# Patient Record
Sex: Male | Born: 1980 | Race: Black or African American | Hispanic: No | Marital: Married | Smoking: Never smoker
Health system: Southern US, Community
[De-identification: ages and names within clinical notes are randomized; demographics above are authoritative.]

## PROBLEM LIST (undated history)

## (undated) DIAGNOSIS — R011 Cardiac murmur, unspecified: Secondary | ICD-10-CM

## (undated) DIAGNOSIS — J45909 Unspecified asthma, uncomplicated: Secondary | ICD-10-CM

## (undated) HISTORY — DX: Cardiac murmur, unspecified: R01.1

## (undated) HISTORY — DX: Unspecified asthma, uncomplicated: J45.909

## (undated) HISTORY — PX: NO PAST SURGERIES: SHX2092

---
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
## 2007-05-22 ENCOUNTER — Encounter: Admission: RE | Admit: 2007-05-22 | Discharge: 2007-05-22 | Payer: Self-pay | Admitting: Internal Medicine

## 2008-12-07 ENCOUNTER — Encounter
Admission: RE | Admit: 2008-12-07 | Discharge: 2008-12-07 | Payer: Self-pay | Admitting: Physical Medicine and Rehabilitation

## 2010-01-14 IMAGING — CR DG CHEST 1V
1 series · 1 of 1 positions shown · non-contrast
Comparison: None

CLINICAL DATA: Employment physical examination requirement.

CHEST - 1 VIEW

[w chest pa]
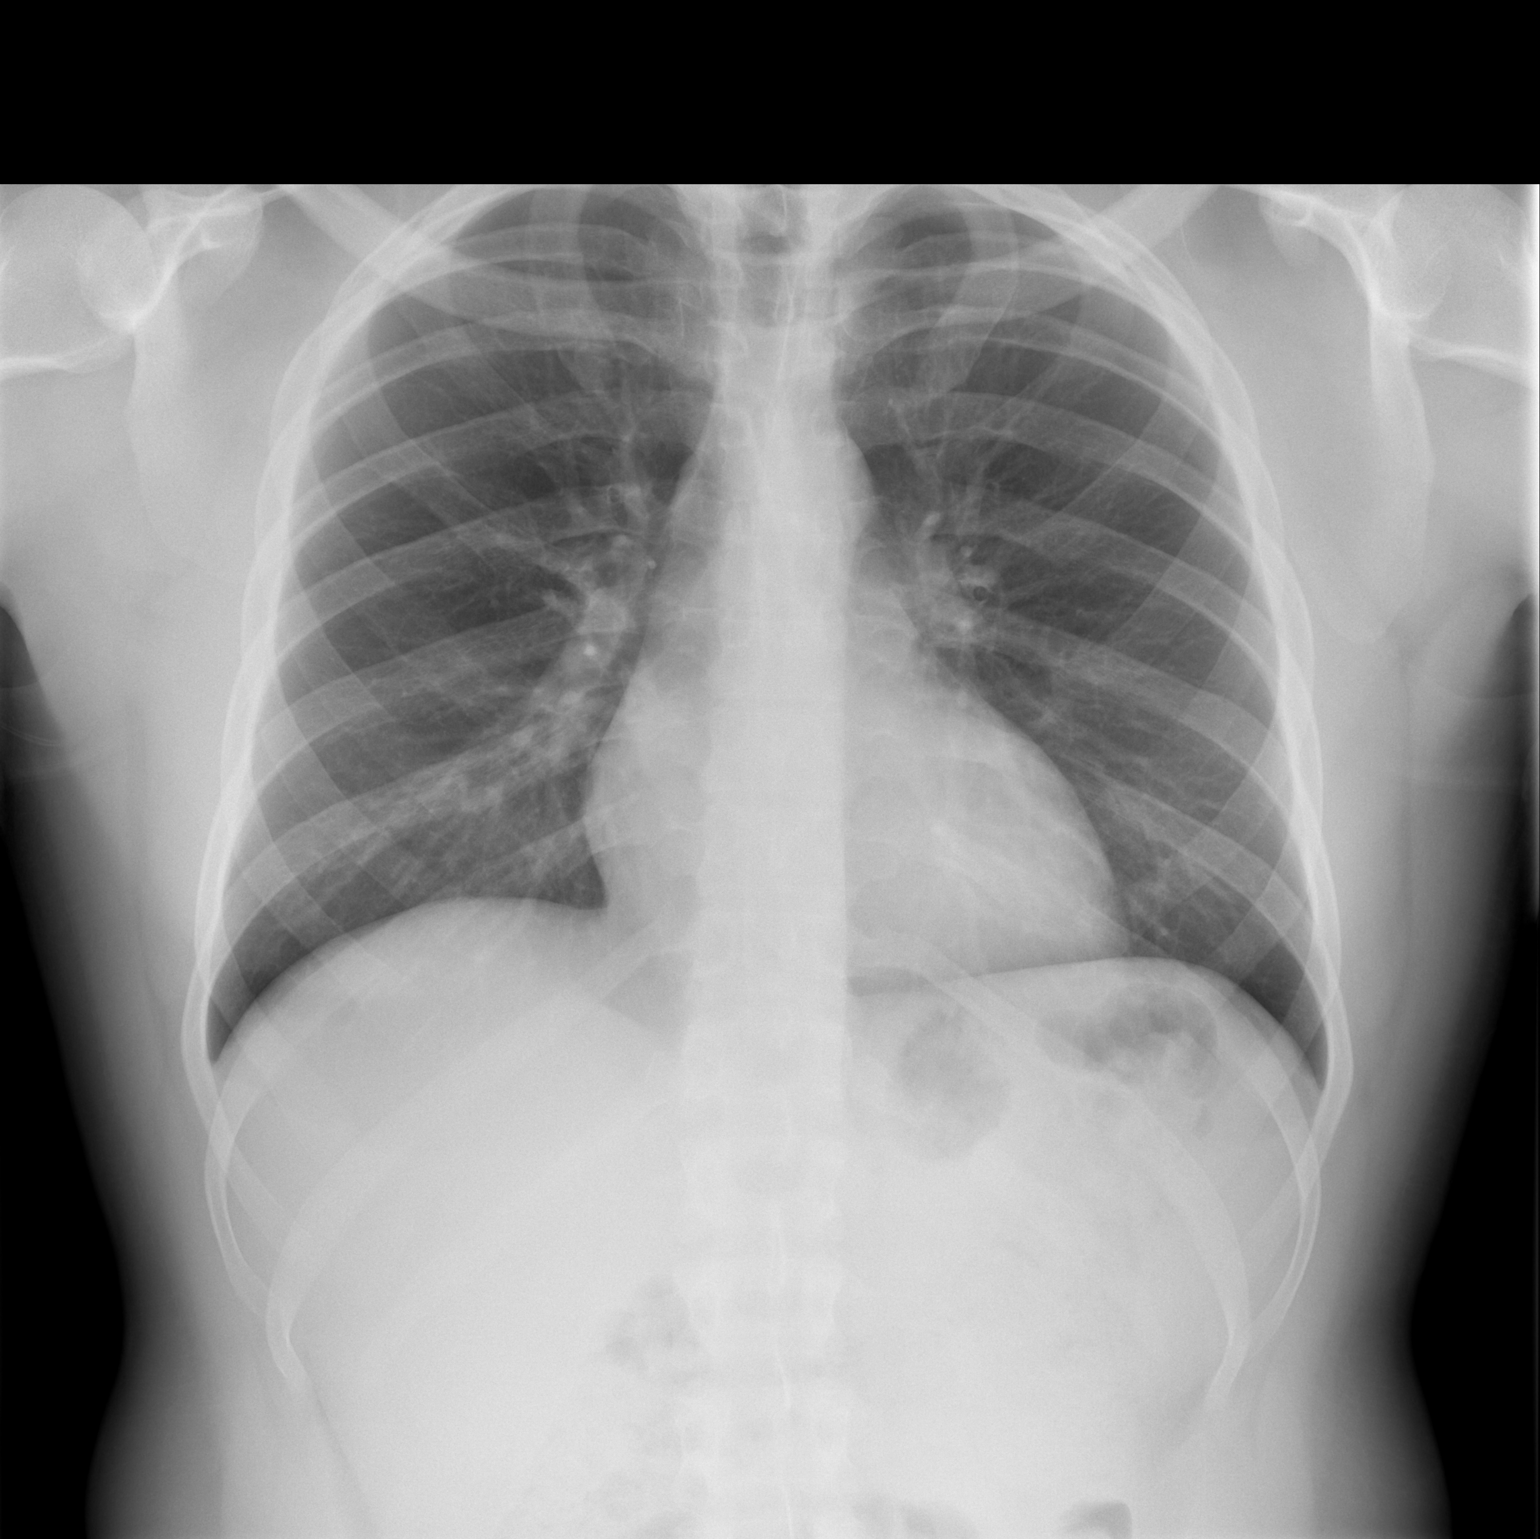

[1 of 1 positions shown; findings below may reference images not displayed]

FINDINGS: The lungs are clear.  No edema or infiltrates are seen.
The heart size, mediastinal contours and hilar contours are within
normal limits.  There are no effusions.  The bony thorax is
unremarkable.
IMPRESSION: Normal chest x-ray.

REF:W2 DICTATED: 12/07/2008 [DATE]

## 2013-02-22 ENCOUNTER — Emergency Department: Payer: Self-pay | Admitting: Emergency Medicine

## 2013-03-08 ENCOUNTER — Emergency Department: Payer: Self-pay | Admitting: Emergency Medicine

## 2016-02-04 ENCOUNTER — Emergency Department: Payer: Managed Care, Other (non HMO)

## 2016-02-04 ENCOUNTER — Emergency Department
Admission: EM | Admit: 2016-02-04 | Discharge: 2016-02-04 | Disposition: A | Discharge status: Home / Self Care | Payer: Managed Care, Other (non HMO) | Attending: Emergency Medicine | Admitting: Emergency Medicine

## 2016-02-04 DIAGNOSIS — N201 Calculus of ureter: Secondary | ICD-10-CM | POA: Insufficient documentation

## 2016-02-04 DIAGNOSIS — R1032 Left lower quadrant pain: Secondary | ICD-10-CM | POA: Diagnosis present

## 2016-02-04 LAB — CBC
HEMATOCRIT: 42.3 % (ref 40.0–52.0)
HEMOGLOBIN: 13.7 g/dL (ref 13.0–18.0)
MCH: 26 pg (ref 26.0–34.0)
MCHC: 32.3 g/dL (ref 32.0–36.0)
MCV: 80.4 fL (ref 80.0–100.0)
Platelets: 169 10*3/uL (ref 150–440)
RBC: 5.26 MIL/uL (ref 4.40–5.90)
RDW: 13.2 % (ref 11.5–14.5)
WBC: 5.9 10*3/uL (ref 3.8–10.6)

## 2016-02-04 LAB — URINALYSIS COMPLETE WITH MICROSCOPIC (ARMC ONLY)
Bilirubin Urine: NEGATIVE
Glucose, UA: NEGATIVE mg/dL
KETONES UR: NEGATIVE mg/dL
LEUKOCYTES UA: NEGATIVE
Nitrite: NEGATIVE
PH: 5 (ref 5.0–8.0)
Protein, ur: 30 mg/dL — AB
SPECIFIC GRAVITY, URINE: 1.021 (ref 1.005–1.030)
SQUAMOUS EPITHELIAL / LPF: NONE SEEN

## 2016-02-04 LAB — COMPREHENSIVE METABOLIC PANEL
ALBUMIN: 4.4 g/dL (ref 3.5–5.0)
ALK PHOS: 63 U/L (ref 38–126)
ALT: 29 U/L (ref 17–63)
ANION GAP: 4 — AB (ref 5–15)
AST: 24 U/L (ref 15–41)
BILIRUBIN TOTAL: 0.5 mg/dL (ref 0.3–1.2)
BUN: 16 mg/dL (ref 6–20)
CALCIUM: 8.7 mg/dL — AB (ref 8.9–10.3)
CO2: 26 mmol/L (ref 22–32)
CREATININE: 1.03 mg/dL (ref 0.61–1.24)
Chloride: 108 mmol/L (ref 101–111)
GFR calc Af Amer: >60 mL/min (ref >60)
GFR calc non Af Amer: >60 mL/min (ref >60)
GLUCOSE: 114 mg/dL — AB (ref 65–99)
Potassium: 3.6 mmol/L (ref 3.5–5.1)
SODIUM: 138 mmol/L (ref 135–145)
TOTAL PROTEIN: 7.2 g/dL (ref 6.5–8.1)

## 2016-02-04 MED ORDER — SODIUM CHLORIDE 0.9 % IV BOLUS (SEPSIS)
1000.0000 mL | Freq: Once | INTRAVENOUS | Status: AC
  Administered 2016-02-04: 1000 mL via INTRAVENOUS

## 2016-02-04 MED ORDER — ONDANSETRON HCL 4 MG/2ML IJ SOLN
4.0000 mg | Freq: Once | INTRAMUSCULAR | Status: AC
  Administered 2016-02-04: 4 mg via INTRAVENOUS
  Filled 2016-02-04: qty 2

## 2016-02-04 MED ORDER — MORPHINE SULFATE (PF) 4 MG/ML IV SOLN
4.0000 mg | Freq: Once | INTRAVENOUS | Status: AC
  Administered 2016-02-04: 4 mg via INTRAVENOUS
  Filled 2016-02-04: qty 1

## 2016-02-04 MED ORDER — KETOROLAC TROMETHAMINE 30 MG/ML IJ SOLN
30.0000 mg | Freq: Once | INTRAMUSCULAR | Status: AC
  Administered 2016-02-04: 30 mg via INTRAVENOUS
  Filled 2016-02-04: qty 1

## 2016-02-04 MED ORDER — OXYCODONE-ACETAMINOPHEN 5-325 MG PO TABS: 1.0000 | ORAL_TABLET | Freq: Four times a day (QID) | ORAL | Status: DC | PRN

## 2016-02-04 NOTE — ED Notes (Signed)
Pt updated on ct result progress.

## 2016-02-04 NOTE — Discharge Instructions (Signed)
Kidney Stones °Kidney stones (urolithiasis) are deposits that form inside your kidneys. The intense pain is caused by the stone moving through the urinary tract. When the stone moves, the ureter goes into spasm around the stone. The stone is usually passed in the urine.  °CAUSES  °· A disorder that makes certain neck glands produce too much parathyroid hormone (primary hyperparathyroidism). °· A buildup of uric acid crystals, similar to gout in your joints. °· Narrowing (stricture) of the ureter. °· A kidney obstruction present at birth (congenital obstruction). °· Previous surgery on the kidney or ureters. °· Numerous kidney infections. °SYMPTOMS  °· Feeling sick to your stomach (nauseous). °· Throwing up (vomiting). °· Blood in the urine (hematuria). °· Pain that usually spreads (radiates) to the groin. °· Frequency or urgency of urination. °DIAGNOSIS  °· Taking a history and physical exam. °· Blood or urine tests. °· CT scan. °· Occasionally, an examination of the inside of the urinary bladder (cystoscopy) is performed. °TREATMENT  °· Observation. °· Increasing your fluid intake. °· Extracorporeal shock wave lithotripsy--This is a noninvasive procedure that uses shock waves to break up kidney stones. °· Surgery may be needed if you have severe pain or persistent obstruction. There are various surgical procedures. Most of the procedures are performed with the use of small instruments. Only small incisions are needed to accommodate these instruments, so recovery time is minimized. °The size, location, and chemical composition are all important variables that will determine the proper choice of action for you. Talk to your health care provider to better understand your situation so that you will minimize the risk of injury to yourself and your kidney.  °HOME CARE INSTRUCTIONS  °· Drink enough water and fluids to keep your urine clear or pale yellow. This will help you to pass the stone or stone fragments. °· Strain  all urine through the provided strainer. Keep all particulate matter and stones for your health care provider to see. The stone causing the pain may be as small as a grain of salt. It is very important to use the strainer each and every time you pass your urine. The collection of your stone will allow your health care provider to analyze it and verify that a stone has actually passed. The stone analysis will often identify what you can do to reduce the incidence of recurrences. °· Only take over-the-counter or prescription medicines for pain, discomfort, or fever as directed by your health care provider. °· Keep all follow-up visits as told by your health care provider. This is important. °· Get follow-up X-rays if required. The absence of pain does not always mean that the stone has passed. It may have only stopped moving. If the urine remains completely obstructed, it can cause loss of kidney function or even complete destruction of the kidney. It is your responsibility to make sure X-rays and follow-ups are completed. Ultrasounds of the kidney can show blockages and the status of the kidney. Ultrasounds are not associated with any radiation and can be performed easily in a matter of minutes. °· Make changes to your daily diet as told by your health care provider. You may be told to: °¨ Limit the amount of salt that you eat. °¨ Eat 5 or more servings of fruits and vegetables each day. °¨ Limit the amount of meat, poultry, fish, and eggs that you eat. °· Collect a 24-hour urine sample as told by your health care provider. You may need to collect another urine sample every 6-12   months. °SEEK MEDICAL CARE IF: °· You experience pain that is progressive and unresponsive to any pain medicine you have been prescribed. °SEEK IMMEDIATE MEDICAL CARE IF:  °· Pain cannot be controlled with the prescribed medicine. °· You have a fever or shaking chills. °· The severity or intensity of pain increases over 18 hours and is not  relieved by pain medicine. °· You develop a new onset of abdominal pain. °· You feel faint or pass out. °· You are unable to urinate. °  °This information is not intended to replace advice given to you by your health care provider. Make sure you discuss any questions you have with your health care provider. °  °Document Released: 10/16/2005 Document Revised: 07/07/2015 Document Reviewed: 03/19/2013 °Elsevier Interactive Patient Education ©2016 [employer] ° °

## 2016-02-04 NOTE — ED Notes (Signed)
Report received from kerry, rn.  

## 2016-02-04 NOTE — ED Provider Notes (Signed)
[facility] Emergency Department Provider Note  Time seen: 6:19 PM  I have reviewed the triage vital signs and the nursing notes.   HISTORY  Chief Complaint Flank Pain    HPI Martin Higgins is a 35 y.o. male with no past medical history who presents to the emergency department with sudden onset of left flank pain. According to the patient approximately 2 hours prior to arrival had sudden onset of left flank pain which has progressively worsened. He now describes the pain as a 10/10. Patient states he was driving and the pain worsened so he called EMS who brought him to the hospital. Patient denies any dysuria, hematuria, history of kidney stones. Denies any changes with his bowel movements states normal bowel movement today. States nausea with the pain but denies any vomiting. Denies any fever, cough or congestion. Denies any strenuous activity today. Currently describes his left flank pain as severe 10/10 sharp pain mostly in the anterior left abdomen.     History reviewed. No pertinent past medical history.  There are no active problems to display for this patient.   History reviewed. No pertinent past surgical history.  No current outpatient prescriptions on file.  Allergies Review of patient's allergies indicates no known allergies.  History reviewed. No pertinent family history.  Social History Social History  Substance Use Topics  . Smoking status: Never Smoker   . Smokeless tobacco: None  . Alcohol Use: No    Review of Systems Constitutional: Negative for fever. Cardiovascular: Negative for chest pain. Respiratory: Negative for shortness of breath. Gastrointestinal:Left flank pain. Positive for nausea. Negative for vomiting or diarrhea. Genitourinary: Negative for dysuria. Negative hematuria. Musculoskeletal: Positive left flank/left back pain Skin: Negative for rash. Neurological: Negative for headache 10-point ROS otherwise  negative.  ____________________________________________   PHYSICAL EXAM:  VITAL SIGNS: ED Triage Vitals  Enc Vitals Group     BP 02/04/16 1807 151/81 mmHg     Pulse Rate 02/04/16 1807 78     Resp 02/04/16 1807 22     Temp --      Temp src --      SpO2 02/04/16 1807 97 %     Weight 02/04/16 1807 225 lb (102.059 kg)     Height 02/04/16 1807 5\' 11"  (1.803 m)     Head Cir --      Peak Flow --      Pain Score 02/04/16 1809 8     Pain Loc --      Pain Edu? --      Excl. in GC? --     Constitutional: Alert and oriented. Well appearing and in no distress. Eyes: Normal exam ENT   Head: Normocephalic and atraumatic.   Mouth/Throat: Mucous membranes are moist. Cardiovascular: Normal rate, regular rhythm. No murmur Respiratory: Normal respiratory effort without tachypnea nor retractions. Breath sounds are clear  Gastrointestinal: Soft, moderate tenderness palpation of the left abdomen, no rebound or guarding. No distention. No CVA tenderness. Musculoskeletal: Nontender with normal range of motion in all extremities. N Neurologic:  Normal speech and language. No gross focal neurologic deficits  Skin:  Skin is warm, dry and intact.  Psychiatric: Mood and affect are normal. Speech and behavior are normal.  ____________________________________________   RADIOLOGY  CT shows a 3 mm left ureteral lithiasis nearly in the bladder.  ____________________________________________    INITIAL IMPRESSION / ASSESSMENT AND PLAN / ED COURSE  Pertinent labs & imaging results that were available during my care of  the patient were reviewed by me and considered in my medical decision making (see chart for details).  She presents by EMS for sudden onset of left flank pain. Patient continues to have 10/10 left flank pain in the emergency department. No history of kidney stones, however the patient's story is very suggestive of ureterolithiasis. Patient does have moderate tenderness to  palpation on exam. We will dose pain medication, check labs, urinalysis, and proceed to CT renal scan to rule out ureterolithiasis.  CT shows 3 mm stone in the distal ureter versus in the bladder. Also consistent with constipation. We'll place the patient on Percocet, Colace, have the patient follow-up with Dr. Apolinar Junes, we'll also provide a urinary strainer. Patient agreeable to plan. Patient states his pain is much improved from earlier.  ____________________________________________   FINAL CLINICAL IMPRESSION(S) / ED DIAGNOSES  Left flank pain Ureterolithiasis  Minna Antis, MD 02/04/16 2037

## 2016-02-04 NOTE — ED Notes (Addendum)
Pt presents with sudden onset left flank pain anterior- states that he felt a sharp pain.  + nausea.  No hx of same.   Per EMS pt had 100 mcg of fentanyl. Pt reports that the pain improved only slightly.

## 2016-02-23 ENCOUNTER — Encounter: Payer: Self-pay | Admitting: Urology

## 2016-02-23 ENCOUNTER — Ambulatory Visit (INDEPENDENT_AMBULATORY_CARE_PROVIDER_SITE_OTHER): Payer: Managed Care, Other (non HMO) | Admitting: Urology

## 2016-02-23 VITALS — BP 124/79 | HR 68 | Ht 69.0 in | Wt 218.7 lb

## 2016-02-23 DIAGNOSIS — S43006A Unspecified dislocation of unspecified shoulder joint, initial encounter: Secondary | ICD-10-CM | POA: Insufficient documentation

## 2016-02-23 DIAGNOSIS — N200 Calculus of kidney: Secondary | ICD-10-CM | POA: Diagnosis not present

## 2016-02-23 DIAGNOSIS — I456 Pre-excitation syndrome: Secondary | ICD-10-CM | POA: Insufficient documentation

## 2016-02-23 DIAGNOSIS — Z9109 Other allergy status, other than to drugs and biological substances: Secondary | ICD-10-CM | POA: Insufficient documentation

## 2016-02-23 DIAGNOSIS — J45909 Unspecified asthma, uncomplicated: Secondary | ICD-10-CM | POA: Insufficient documentation

## 2016-02-23 LAB — URINALYSIS, COMPLETE
BILIRUBIN UA: NEGATIVE
GLUCOSE, UA: NEGATIVE
Ketones, UA: NEGATIVE
LEUKOCYTES UA: NEGATIVE
Nitrite, UA: NEGATIVE
PROTEIN UA: NEGATIVE
RBC UA: NEGATIVE
SPEC GRAV UA: 1.02 (ref 1.005–1.030)
UUROB: 0.2 mg/dL (ref 0.2–1.0)
pH, UA: 5.5 (ref 5.0–7.5)

## 2016-02-23 LAB — MICROSCOPIC EXAMINATION
BACTERIA UA: NONE SEEN
Epithelial Cells (non renal): NONE SEEN /hpf (ref 0–10)
WBC UA: NONE SEEN /HPF (ref 0–?)

## 2016-02-23 NOTE — Progress Notes (Signed)
02/23/2016 10:43 AM   Corey HaroldErik Mckay 08/24/1981 [id number]  Referring provider: No referring provider defined for this encounter.  Chief Complaint  Patient presents with  . New Patient (Initial Visit)    renal stones     HPI: The patient is a 35 year old gentleman who recently passed 2 left ureteral calculi a few weeks ago. He has had no problems since he passes stones. His only complaint at the time of stone passage with severe pain. He had no nausea or vomiting or fevers or chills. He is doing well today and is back to baseline. He's never had stones before.   PMH: Past Medical History  Diagnosis Date  . Asthma   . Heart murmur     Surgical History: Past Surgical History  Procedure Laterality Date  . No past surgeries      Home Medications:    Medication List       This list is accurate as of: 02/23/16 10:43 AM.  Always use your most recent med list.               PROAIR HFA 108 (90 Base) MCG/ACT inhaler  Generic drug:  albuterol  Inhale into the lungs.        Allergies: No Known Allergies  Family History: Family History  Problem Relation Age of Onset  . Hematuria Neg Hx   . Renal cancer Neg Hx   . Prostate cancer Neg Hx     Social History:  reports that he has never smoked. He does not have any smokeless tobacco history on file. He reports that he does not drink alcohol or use illicit drugs.  ROS: UROLOGY Frequent Urination?: No Hard to postpone urination?: No Burning/pain with urination?: No Get up at night to urinate?: Yes Leakage of urine?: No Urine stream starts and stops?: No Trouble starting stream?: No Do you have to strain to urinate?: No Blood in urine?: No Urinary tract infection?: No Sexually transmitted disease?: No Injury to kidneys or bladder?: No Painful intercourse?: No Weak stream?: No Erection problems?: No Penile pain?: No  Gastrointestinal Nausea?: No Vomiting?: No Indigestion/heartburn?: No Diarrhea?:  No Constipation?: No  Constitutional Fever: No Night sweats?: Yes Weight loss?: No Fatigue?: No  Skin Skin rash/lesions?: No Itching?: No  Eyes Blurred vision?: No Double vision?: No  Ears/Nose/Throat Sore throat?: No Sinus problems?: No  Hematologic/Lymphatic Swollen glands?: No Easy bruising?: No  Cardiovascular Leg swelling?: No Chest pain?: No  Respiratory Cough?: No Shortness of breath?: No  Endocrine Excessive thirst?: No  Musculoskeletal Back pain?: No Joint pain?: No  Neurological Headaches?: No Dizziness?: No  Psychologic Depression?: No Anxiety?: No  Physical Exam: BP 124/79 mmHg  Pulse 68  Ht 5\' 9"  (1.753 m)  Wt 218 lb 11.2 oz (99.202 kg)  BMI 32.28 kg/m2  Constitutional:  Alert and oriented, No acute distress. HEENT: [place] AT, moist mucus membranes.  Trachea midline, no masses. Cardiovascular: No clubbing, cyanosis, or edema. Respiratory: Normal respiratory effort, no increased work of breathing. GI: Abdomen is soft, nontender, nondistended, no abdominal masses GU: No CVA tenderness.  Skin: No rashes, bruises or suspicious lesions. Lymph: No cervical or inguinal adenopathy. Neurologic: Grossly intact, no focal deficits, moving all 4 extremities. Psychiatric: Normal mood and affect.  Laboratory Data: Lab Results  Component Value Date   WBC 5.9 02/04/2016   HGB 13.7 02/04/2016   HCT 42.3 02/04/2016   MCV 80.4 02/04/2016   PLT 169 02/04/2016    Lab Results  Component Value  Date   CREATININE 1.03 02/04/2016    No results found for: PSA  No results found for: TESTOSTERONE  No results found for: HGBA1C  Urinalysis    Component Value Date/Time   COLORURINE YELLOW* 02/04/2016 1937   APPEARANCEUR HAZY* 02/04/2016 1937   LABSPEC 1.021 02/04/2016 1937   PHURINE 5.0 02/04/2016 1937   GLUCOSEU NEGATIVE 02/04/2016 1937   HGBUR 3+* 02/04/2016 1937   BILIRUBINUR NEGATIVE 02/04/2016 1937   KETONESUR NEGATIVE 02/04/2016 1937    PROTEINUR 30* 02/04/2016 1937   NITRITE NEGATIVE 02/04/2016 1937   LEUKOCYTESUR NEGATIVE 02/04/2016 1937    Pertinent Imaging: CLINICAL DATA: Right flank pain.  EXAM: CT ABDOMEN AND PELVIS WITHOUT CONTRAST  TECHNIQUE: Multidetector CT imaging of the abdomen and pelvis was performed following the standard protocol without IV contrast.  COMPARISON: None.  FINDINGS: Normal lung bases.  No free air or free fluid.  No renal stones are identified. No hydronephrosis or mass seen in the right kidney. The right ureter is normal in caliber with no right ureteral stones. There is mild hydronephrosis on the left with mild fat stranding adjacent to the left renal pelvis and proximal ureter. The left ureter is larger than the right and there is a 3 mm stone just within the left side of the bladder.  The liver, gallbladder, spleen, adrenal glands, and pancreas are normal. A hiatal hernia is identified. The colon and appendix are normal. The stomach is normal. There is fecal material within left lower quadrant loops of small bowel with no bowel wall thickening. No aneurysm. No adenopathy or mass.  The pelvis demonstrates a small stone just within the left side of the bladder. Fecal material is seen within small bowel loops in the upper left pelvis and lower abdomen. No other abnormalities.  Visualized bones are normal.  IMPRESSION: 1. 3 mm stone just within the left side of the bladder with mild ureterectasis and mild perinephric stranding. The patient's pain was reported to be on the right but the findings are most consistent with a recently passed left stone. 2. Fecal material within small bowel loops in the lower left abdomen and upper left pelvis. This suggests slow transit time but no underlying cause is seen.  Assessment & Plan:    1. Renal stones -The patient spontaneously passed his stones and has no further stone burden. He'll bring his stone back to the  office for analysis and we will let him know his results. He will otherwise follow-up as needed.   Return if symptoms worsen or fail to improve.  Hildred Laser, MD  [facility] [address], [address] [place], [place] [address] [phone]

## 2018-08-07 ENCOUNTER — Emergency Department
Admission: EM | Admit: 2018-08-07 | Discharge: 2018-08-07 | Disposition: A | Discharge status: Home / Self Care | Payer: Commercial Managed Care - PPO | Attending: Emergency Medicine | Admitting: Emergency Medicine

## 2018-08-07 ENCOUNTER — Other Ambulatory Visit: Payer: Self-pay

## 2018-08-07 ENCOUNTER — Encounter: Payer: Self-pay | Admitting: Emergency Medicine

## 2018-08-07 ENCOUNTER — Emergency Department: Payer: Commercial Managed Care - PPO

## 2018-08-07 DIAGNOSIS — N200 Calculus of kidney: Secondary | ICD-10-CM | POA: Diagnosis not present

## 2018-08-07 DIAGNOSIS — R109 Unspecified abdominal pain: Secondary | ICD-10-CM | POA: Diagnosis present

## 2018-08-07 DIAGNOSIS — J45909 Unspecified asthma, uncomplicated: Secondary | ICD-10-CM | POA: Insufficient documentation

## 2018-08-07 LAB — CBC
HEMATOCRIT: 46.3 % (ref 39.0–52.0)
Hemoglobin: 14.8 g/dL (ref 13.0–17.0)
MCH: 27 pg (ref 26.0–34.0)
MCHC: 32 g/dL (ref 30.0–36.0)
MCV: 84.3 fL (ref 80.0–100.0)
NRBC: 0 % (ref 0.0–0.2)
Platelets: 209 10*3/uL (ref 150–400)
RBC: 5.49 MIL/uL (ref 4.22–5.81)
RDW: 12.3 % (ref 11.5–15.5)
WBC: 5.8 10*3/uL (ref 4.0–10.5)

## 2018-08-07 LAB — URINALYSIS, COMPLETE (UACMP) WITH MICROSCOPIC
BACTERIA UA: NONE SEEN
BILIRUBIN URINE: NEGATIVE
Glucose, UA: NEGATIVE mg/dL
HGB URINE DIPSTICK: NEGATIVE
Ketones, ur: NEGATIVE mg/dL
LEUKOCYTES UA: NEGATIVE
Nitrite: NEGATIVE
PROTEIN: NEGATIVE mg/dL
Specific Gravity, Urine: 1.023 (ref 1.005–1.030)
pH: 5 (ref 5.0–8.0)

## 2018-08-07 LAB — COMPREHENSIVE METABOLIC PANEL
ALT: 21 U/L (ref 0–44)
AST: 20 U/L (ref 15–41)
Albumin: 4.3 g/dL (ref 3.5–5.0)
Alkaline Phosphatase: 57 U/L (ref 38–126)
Anion gap: 7 (ref 5–15)
BUN: 15 mg/dL (ref 6–20)
CHLORIDE: 105 mmol/L (ref 98–111)
CO2: 30 mmol/L (ref 22–32)
CREATININE: 1.24 mg/dL (ref 0.61–1.24)
Calcium: 9.2 mg/dL (ref 8.9–10.3)
GFR calc non Af Amer: >60 mL/min (ref >60)
Glucose, Bld: 114 mg/dL — ABNORMAL HIGH (ref 70–99)
Potassium: 3.7 mmol/L (ref 3.5–5.1)
Sodium: 142 mmol/L (ref 135–145)
Total Bilirubin: 0.3 mg/dL (ref 0.3–1.2)
Total Protein: 6.9 g/dL (ref 6.5–8.1)

## 2018-08-07 LAB — LIPASE, BLOOD: LIPASE: 33 U/L (ref 11–51)

## 2018-08-07 MED ORDER — TAMSULOSIN HCL 0.4 MG PO CAPS: 0.4000 mg | ORAL_CAPSULE | Freq: Every day | ORAL | 0 refills | Status: AC

## 2018-08-07 MED ORDER — ONDANSETRON HCL 4 MG/2ML IJ SOLN
4.0000 mg | Freq: Once | INTRAMUSCULAR | Status: AC
  Administered 2018-08-07: 4 mg via INTRAVENOUS
  Filled 2018-08-07: qty 2

## 2018-08-07 MED ORDER — TAMSULOSIN HCL 0.4 MG PO CAPS
0.4000 mg | ORAL_CAPSULE | Freq: Once | ORAL | Status: AC
  Administered 2018-08-07: 0.4 mg via ORAL
  Filled 2018-08-07: qty 1

## 2018-08-07 MED ORDER — ONDANSETRON 4 MG PO TBDP: 4.0000 mg | ORAL_TABLET | Freq: Three times a day (TID) | ORAL | 0 refills | Status: AC | PRN

## 2018-08-07 MED ORDER — IOPAMIDOL (ISOVUE-300) INJECTION 61%
100.0000 mL | Freq: Once | INTRAVENOUS | Status: AC | PRN
  Administered 2018-08-07: 100 mL via INTRAVENOUS

## 2018-08-07 MED ORDER — OXYCODONE HCL 5 MG PO TABS
5.0000 mg | ORAL_TABLET | Freq: Once | ORAL | Status: AC
  Administered 2018-08-07: 5 mg via ORAL
  Filled 2018-08-07: qty 1

## 2018-08-07 MED ORDER — OXYCODONE-ACETAMINOPHEN 5-325 MG PO TABS: 1.0000 | ORAL_TABLET | ORAL | 0 refills | Status: AC | PRN

## 2018-08-07 MED ORDER — KETOROLAC TROMETHAMINE 30 MG/ML IJ SOLN
15.0000 mg | Freq: Once | INTRAMUSCULAR | Status: AC
  Administered 2018-08-07: 15 mg via INTRAVENOUS
  Filled 2018-08-07: qty 1

## 2018-08-07 MED ORDER — IBUPROFEN 600 MG PO TABS: 600.0000 mg | ORAL_TABLET | Freq: Four times a day (QID) | ORAL | 0 refills | Status: AC | PRN

## 2018-08-07 MED ORDER — ACETAMINOPHEN 500 MG PO TABS
1000.0000 mg | ORAL_TABLET | Freq: Once | ORAL | Status: AC
Start: 2018-08-07 — End: 2018-08-07
  Administered 2018-08-07: 1000 mg via ORAL
  Filled 2018-08-07: qty 2

## 2018-08-07 NOTE — ED Triage Notes (Signed)
Pt in via POV with complaints acute onset RUQ abdominal pain w/ some nausea, denies vomiting/diarrhea.  Pt ambulatory to triage, NAD noted at this time.

## 2018-08-07 NOTE — ED Provider Notes (Signed)
[facility] Emergency Department Provider Note  ____________________________________________  Time seen: Approximately 6:11 PM  I have reviewed the triage vital signs and the nursing notes.   HISTORY  Chief Complaint Abdominal Pain   HPI Martin Higgins is a 37 y.o. male with a history of asthma who presents for evaluation of abdominal pain.  Patient reports sudden onset of sharp right-sided abdominal pain that woke him up from his sleep at 3 PM this afternoon.  The pain is currently 8 out of 10.  He denies any similar pain in the past.  Had a history of a kidney stone however the pain at that time was in the flank.  No hematuria, dysuria, vomiting, fever, chills, constipation, diarrhea, chest pain or shortness of breath.  The pain is not pleuritic in nature.  No personal or family history of blood clots, recent travel immobilization, leg pain or swelling, hemoptysis, or exogenous hormones.  No past abdominal surgeries.  Past Medical History:  Diagnosis Date  . Asthma   . Heart murmur     Patient Active Problem List   Diagnosis Date Noted  . Asthma without status asthmaticus 02/23/2016  . 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 Dislocated shoulder 02/23/2016  . 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 NO PAST SURGERIES      Prior to Admission medications   Medication Sig Start Date End Date Taking? Authorizing Provider  albuterol (PROAIR HFA) 108 (90 Base) MCG/ACT inhaler Inhale into the lungs.    [provider]  ibuprofen (ADVIL,MOTRIN) 600 MG tablet Take 1 tablet (600 mg total) by mouth every 6 (six) hours as needed. 08/07/18   Nita Sickle, MD  ondansetron (ZOFRAN ODT) 4 MG disintegrating tablet Take 1 tablet (4 mg total) by mouth every 8 (eight) hours as needed for nausea or vomiting. 08/07/18   Nita Sickle, MD  oxyCODONE-acetaminophen (PERCOCET) 5-325  MG tablet Take 1 tablet by mouth every 4 (four) hours as needed for severe pain. 08/07/18   Nita Sickle, MD  tamsulosin (FLOMAX) 0.4 MG CAPS capsule Take 1 capsule (0.4 mg total) by mouth daily for 7 days. 08/07/18 08/14/18  Nita Sickle, MD    Allergies Patient has no known allergies.  Family History  Problem Relation Age of Onset  . Hematuria Neg Hx   . Renal cancer Neg Hx   . Prostate cancer Neg Hx     Social History Social History   Tobacco Use  . Smoking status: Never Smoker  . Smokeless tobacco: Never Used  Substance Use Topics  . Alcohol use: No    Alcohol/week: 0.0 standard drinks  . Drug use: No    Review of Systems  Constitutional: Negative for fever. Eyes: Negative for visual changes. ENT: Negative for sore throat. Neck: No neck pain  Cardiovascular: Negative for chest pain. Respiratory: Negative for shortness of breath. Gastrointestinal: + R sided abdominal pain and nausea. No vomiting or diarrhea. Genitourinary: Negative for dysuria. Musculoskeletal: Negative for back pain. Skin: Negative for rash. Neurological: Negative for headaches, weakness or numbness. Psych: No SI or HI  ____________________________________________   PHYSICAL EXAM:  VITAL SIGNS: ED Triage Vitals  Enc Vitals Group     BP 08/07/18 1540 (!) 165/96     Pulse Rate 08/07/18 1540 72     Resp --      Temp 08/07/18 1540 97.6 F (36.4 C)     Temp Source 08/07/18 1540 Oral  SpO2 08/07/18 1540 100 %     Weight 08/07/18 1540 225 lb (102.1 kg)     Height 08/07/18 1540 5\' 9"  (1.753 m)     Head Circumference --      Peak Flow --      Pain Score 08/07/18 1556 9     Pain Loc --      Pain Edu? --      Excl. in GC? --     Constitutional: Alert and oriented. Well appearing and in no apparent distress. HEENT:      Head: Normocephalic and atraumatic.         Eyes: Conjunctivae are normal. Sclera is non-icteric.       Mouth/Throat: Mucous membranes are moist.       Neck:  Supple with no signs of meningismus. Cardiovascular: Regular rate and rhythm. No murmurs, gallops, or rubs. 2+ symmetrical distal pulses are present in all extremities. No JVD. Respiratory: Normal respiratory effort. Lungs are clear to auscultation bilaterally. No wheezes, crackles, or rhonchi.  Gastrointestinal: Soft, mild R sided tenderness, negative Murphy's sign, and non distended with positive bowel sounds. No rebound or guarding. Musculoskeletal: Nontender with normal range of motion in all extremities. No edema, cyanosis, or erythema of extremities. Neurologic: Normal speech and language. Face is symmetric. Moving all extremities. No gross focal neurologic deficits are appreciated. Skin: Skin is warm, dry and intact. No rash noted. Psychiatric: Mood and affect are normal. Speech and behavior are normal.  ____________________________________________   LABS (all labs ordered are listed, but only abnormal results are displayed)  Labs Reviewed  COMPREHENSIVE METABOLIC PANEL - Abnormal; Notable for the following components:      Result Value   Glucose, Bld 114 (*)    All other components within normal limits  URINALYSIS, COMPLETE (UACMP) WITH MICROSCOPIC - Abnormal; Notable for the following components:   Color, Urine YELLOW (*)    APPearance CLEAR (*)    All other components within normal limits  LIPASE, BLOOD  CBC   ____________________________________________  EKG  none  ____________________________________________  RADIOLOGY  I have personally reviewed the images performed during this visit and I agree with the Radiologist's read.   Interpretation by Radiologist:  Ct Abdomen Pelvis W Contrast  Result Date: 08/07/2018 CLINICAL DATA:  Right upper quadrant abdomen pain EXAM: CT ABDOMEN AND PELVIS WITH CONTRAST TECHNIQUE: Multidetector CT imaging of the abdomen and pelvis was performed using the standard protocol following bolus administration of intravenous contrast.  CONTRAST:  ISOVUE-300 IOPAMIDOL (ISOVUE-300) INJECTION 61% COMPARISON:  February 04, 2016 FINDINGS: Lower chest: Minimal dependent atelectasis of posterior lung bases are identified. The heart size is normal. Hepatobiliary: No focal liver abnormality is seen. No gallstones, gallbladder wall thickening, or biliary dilatation. Pancreas: Unremarkable. No pancreatic ductal dilatation or surrounding inflammatory changes. Spleen: Normal in size without focal abnormality. Adrenals/Urinary Tract: The bilateral adrenal glands are normal. There is a 2 mm nonobstructing stone in the midpole right kidney. There is a probable 1 mm nonobstructing stone in the mid right ureter best seen on image 48 series 2. There is no left hydronephrosis. The left kidney is normal. The bladder is normal. Stomach/Bowel: Stomach is within normal limits. Appendix appears normal. No evidence of bowel wall thickening, distention, or inflammatory changes. Vascular/Lymphatic: No significant vascular findings are present. No enlarged abdominal or pelvic lymph nodes. Reproductive: Prostate calcifications are identified. Other: None. Musculoskeletal: No acute or significant osseous findings. IMPRESSION: Nonobstructing stones in the right kidney and mid right ureter.  No hydronephrosis bilaterally. No bowel obstruction or diverticulitis.  The appendix is normal. Electronically Signed   By: Sherian Rein M.D.   On: 08/07/2018 18:38      ____________________________________________   PROCEDURES  Procedure(s) performed: None Procedures Critical Care performed:  None ____________________________________________   INITIAL IMPRESSION / ASSESSMENT AND PLAN / ED COURSE   37 y.o. male with a history of asthma who presents for evaluation of sudden onset of sharp right-sided abdominal pain associated with nausea.  Patient is well-appearing and in no distress, has normal vital signs, has mild tenderness diffusely on the right quadrants with negative  Murphy sign and no CVA tenderness.  Differential diagnoses including kidney stone versus gallbladder disease versus appendicitis.  Urinalysis with no evidence of infection or blood, CBC with no evidence of leukocytosis, CMP and lipase are within normal limits.  Patient was given IV Toradol and Zofran and will be sent for a CT abdomen pelvis.  Clinical Course as of Aug 07 1952  Wed Aug 07, 2018  1951 CT positive for right-sided kidney stones with no evidence of obstruction, no overlying UTI or kidney dysfunction.  Pain is well controlled at this time.  Will discharge home with ibuprofen, Percocet, Zofran and Flomax.  Discussed return precautions for any signs of superinfection.   [CV]    Clinical Course User Index [CV] Don Perking [place], MD     As part of my medical decision making, I reviewed the following data within the electronic MEDICAL RECORD NUMBER Nursing notes reviewed and incorporated, Labs reviewed , Old chart reviewed, Radiograph reviewed , Notes from prior ED visits and [place] Controlled Substance Database    Pertinent labs & imaging results that were available during my care of the patient were reviewed by me and considered in my medical decision making (see chart for details).    ____________________________________________   FINAL CLINICAL IMPRESSION(S) / ED DIAGNOSES  Final diagnoses:  Kidney stone      NEW MEDICATIONS STARTED DURING THIS VISIT:  ED Discharge Orders         Ordered    tamsulosin (FLOMAX) 0.4 MG CAPS capsule  Daily     08/07/18 1952    ibuprofen (ADVIL,MOTRIN) 600 MG tablet  Every 6 hours PRN     08/07/18 1952    oxyCODONE-acetaminophen (PERCOCET) 5-325 MG tablet  Every 4 hours PRN     08/07/18 1952    ondansetron (ZOFRAN ODT) 4 MG disintegrating tablet  Every 8 hours PRN     08/07/18 1952           Note:  This document was prepared using Dragon voice recognition software and may include unintentional dictation errors.    Nita Sickle,  MD 08/07/18 [phone]

## 2018-08-07 NOTE — Discharge Instructions (Addendum)

## 2018-12-24 DIAGNOSIS — Z125 Encounter for screening for malignant neoplasm of prostate: Secondary | ICD-10-CM | POA: Diagnosis not present

## 2018-12-24 DIAGNOSIS — E7800 Pure hypercholesterolemia, unspecified: Secondary | ICD-10-CM | POA: Diagnosis not present

## 2018-12-24 DIAGNOSIS — I456 Pre-excitation syndrome: Secondary | ICD-10-CM | POA: Diagnosis not present

## 2018-12-24 DIAGNOSIS — Z0189 Encounter for other specified special examinations: Secondary | ICD-10-CM | POA: Diagnosis not present

## 2018-12-24 DIAGNOSIS — R05 Cough: Secondary | ICD-10-CM | POA: Diagnosis not present

## 2018-12-24 DIAGNOSIS — Z79899 Other long term (current) drug therapy: Secondary | ICD-10-CM | POA: Diagnosis not present

## 2018-12-24 DIAGNOSIS — Z0000 Encounter for general adult medical examination without abnormal findings: Secondary | ICD-10-CM | POA: Diagnosis not present

## 2018-12-24 DIAGNOSIS — J4520 Mild intermittent asthma, uncomplicated: Secondary | ICD-10-CM | POA: Diagnosis not present

## 2018-12-24 DIAGNOSIS — Z131 Encounter for screening for diabetes mellitus: Secondary | ICD-10-CM | POA: Diagnosis not present

## 2019-08-04 ENCOUNTER — Ambulatory Visit: Payer: Self-pay | Admitting: Physician Assistant

## 2019-08-04 ENCOUNTER — Other Ambulatory Visit: Payer: Self-pay

## 2019-08-04 DIAGNOSIS — Z113 Encounter for screening for infections with a predominantly sexual mode of transmission: Secondary | ICD-10-CM

## 2019-08-04 DIAGNOSIS — Z202 Contact with and (suspected) exposure to infections with a predominantly sexual mode of transmission: Secondary | ICD-10-CM

## 2019-08-04 MED ORDER — AZITHROMYCIN 500 MG PO TABS
1000.0000 mg | ORAL_TABLET | Freq: Once | ORAL | Status: AC
  Administered 2019-08-04: 1000 mg via ORAL

## 2019-08-05 ENCOUNTER — Encounter: Payer: Self-pay | Admitting: Physician Assistant

## 2019-08-05 NOTE — Progress Notes (Signed)
    STI clinic/screening visit  Subjective:  Martin Higgins is a 38 y.o. male being seen today for an STI screening visit. The patient reports they do not have symptoms.  Patient has the following medical conditions:   Patient Active Problem List   Diagnosis Date Noted  . Asthma without status asthmaticus 02/23/2016  . 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 Dislocated shoulder 02/23/2016  . 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 SEXUALLY TRANSMITTED DISEASE    HPI  Patient reports that he is not having symptoms.  States that his partner tested positive for Chlamydia.  See flowsheet for further details and programmatic requirements.    The following portions of the patient's history were reviewed and updated as appropriate: allergies, current medications, past medical history, past social history, past surgical history and problem list.  Objective:  There were no vitals filed for this visit.  Physical Exam Constitutional:      General: He is not in acute distress.    Appearance: Normal appearance.  HENT:     Head: Normocephalic and atraumatic.     Mouth/Throat:     Mouth: Mucous membranes are moist.     Pharynx: Oropharynx is clear. No oropharyngeal exudate or posterior oropharyngeal erythema.  Eyes:     Conjunctiva/sclera: Conjunctivae normal.  Neck:     Musculoskeletal: Neck supple.  Pulmonary:     Effort: Pulmonary effort is normal.  Abdominal:     Palpations: Abdomen is soft. There is no mass.     Tenderness: There is no abdominal tenderness. There is no guarding or rebound.  Lymphadenopathy:     Cervical: No cervical adenopathy.  Skin:    General: Skin is warm and dry.     Findings: No bruising, erythema, lesion or rash.  Neurological:     Mental Status: He is alert and oriented to person, place, and time.  Psychiatric:        Mood and Affect: Mood normal.        Behavior: Behavior normal.         Thought Content: Thought content normal.        Judgment: Judgment normal.       Assessment and Plan:  Martin Higgins is a 38 y.o. male presenting to the [facility] Department for STI screening  1. Screening for STD (sexually transmitted disease) Patient is without symptoms today. Rec condoms with all sex. Await test results.  Counseled that RN will call if needs to RTC for further treatment once results are back.  - HIV [facility] - Syphilis Serology, [facility] - GC/Chlamydia Probe Amp([facility])  2. Chlamydia contact Will treat as a contact to Chlamydia with Azithromycin 1g po DOT today. No sex for 7 days and until after partner completes treatment. Rec RTC if vomits < 2 hr after taking medicine for re-treatment. - azithromycin (ZITHROMAX) tablet 1,000 mg     No follow-ups on file.  No future appointments.  Jerene Dilling, PA

## 2019-08-06 LAB — GC/CHLAMYDIA PROBE AMP
Chlamydia trachomatis, NAA: NEGATIVE
Neisseria Gonorrhoeae by PCR: NEGATIVE

## 2019-08-20 ENCOUNTER — Encounter: Payer: Self-pay | Admitting: Physician Assistant

## 2019-09-14 IMAGING — CT CT ABD-PELV W/ CM
2 of 4 series · 16 of 46 positions shown, 18 images · IV contrast (APPLIED)
Comparison: February 04, 2016

CLINICAL DATA: Right upper quadrant abdomen pain

EXAM:
CT ABDOMEN AND PELVIS WITH CONTRAST
TECHNIQUE: Multidetector CT imaging of the abdomen and pelvis was performed
using the standard protocol following bolus administration of
intravenous contrast.
CONTRAST:  100mL TC53KJ-OTT IOPAMIDOL (TC53KJ-OTT) INJECTION 61%

[Series 2: axial st · axial · 0.70mm/px · z∈[-795,-360]mm · 13 of 95 slices shown, 15 images]
[im 4/95  soft-tissue]
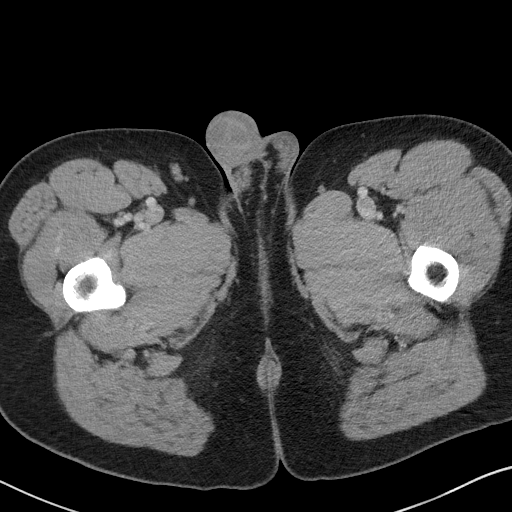
[im 4/95  bone]
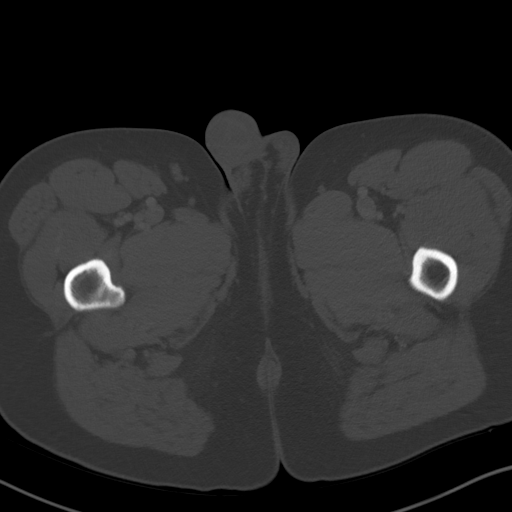
[im 11/95  soft-tissue]
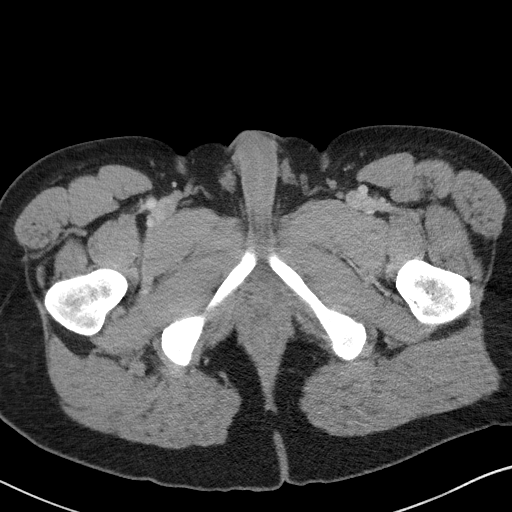
[im 19/95  soft-tissue]
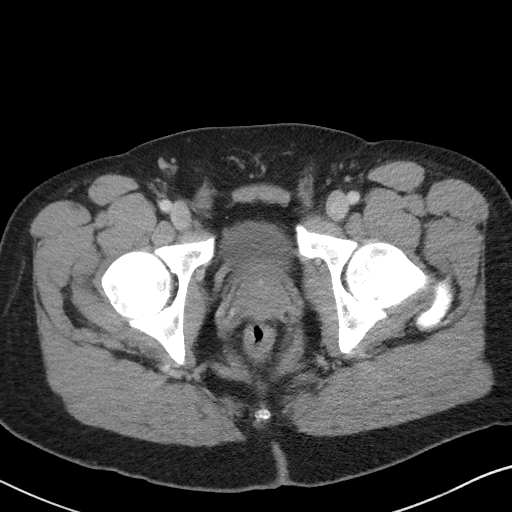
[im 26/95  soft-tissue]
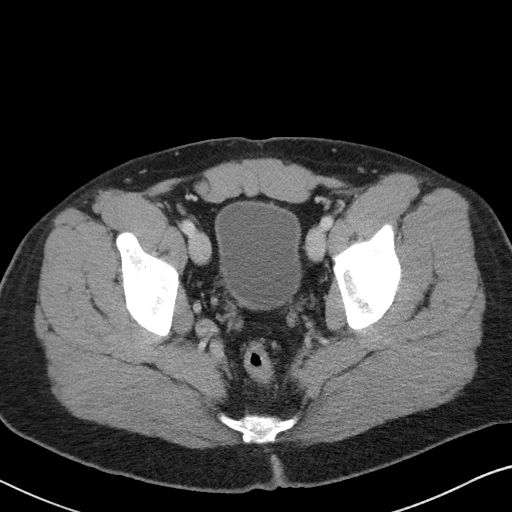
[im 33/95  soft-tissue]
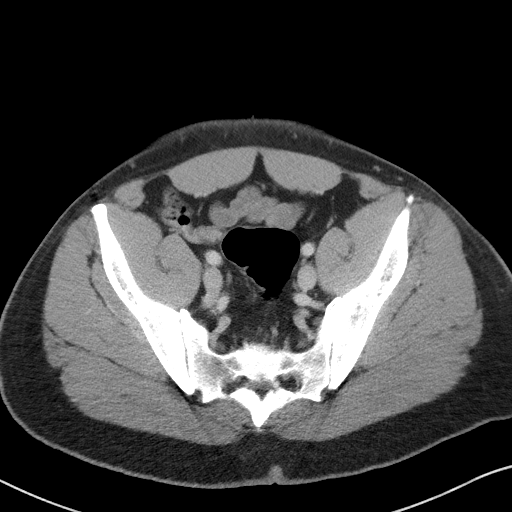
[im 40/95  soft-tissue]
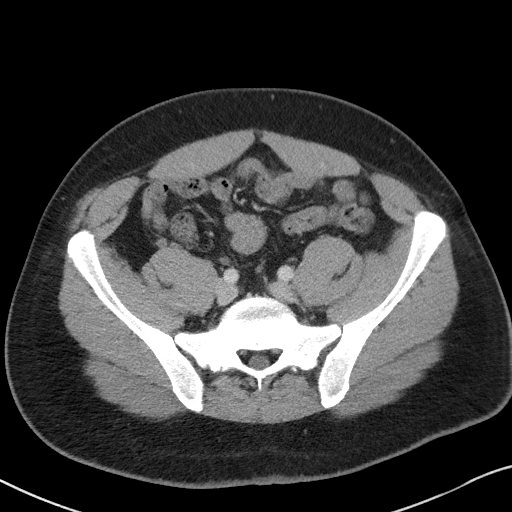
[im 48/95  soft-tissue]
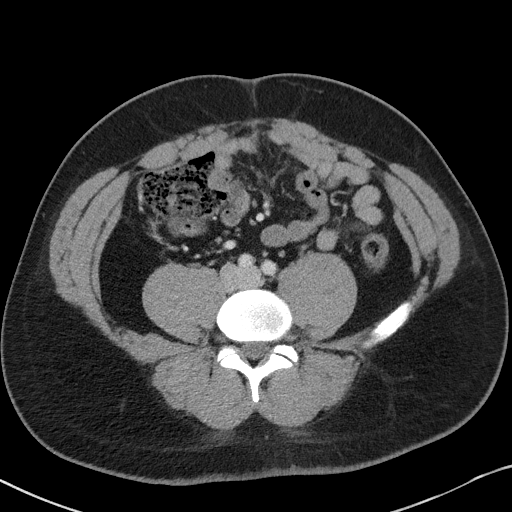
[im 55/95  soft-tissue]
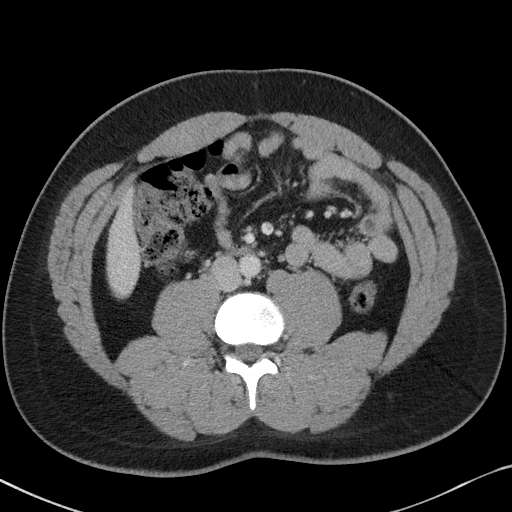
[im 62/95  soft-tissue]
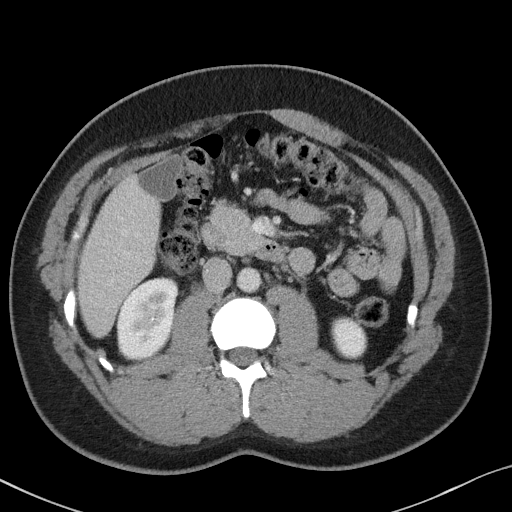
[im 62/95  bone]
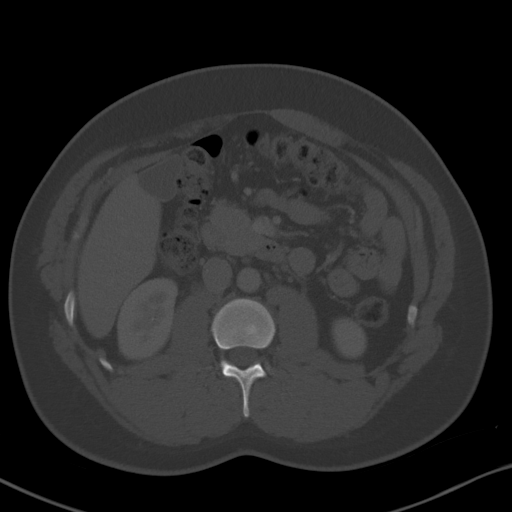
[im 69/95  soft-tissue]
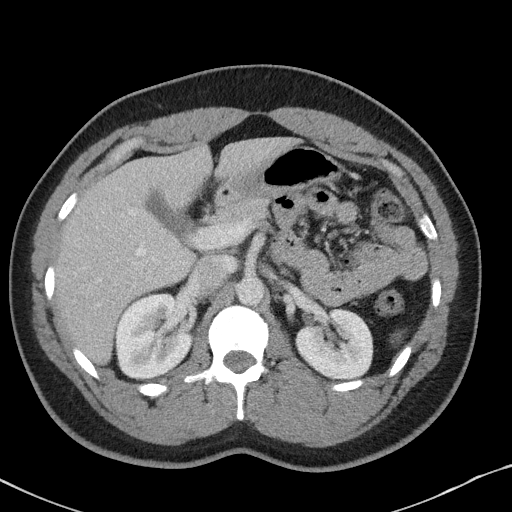
[im 76/95  soft-tissue]
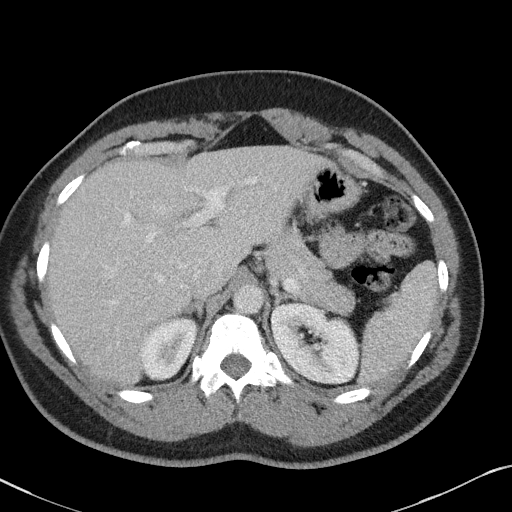
[im 84/95  soft-tissue]
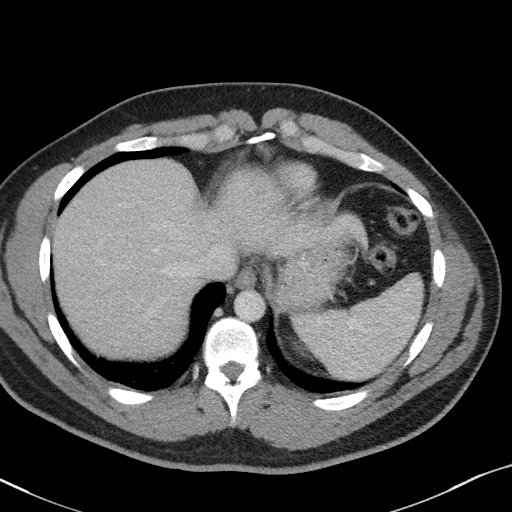
[im 91/95  soft-tissue]
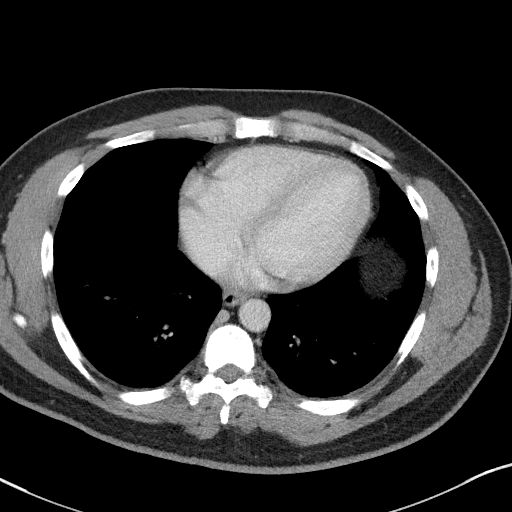

[Series 5: coronal st · coronal · 0.68mm/px · 3 of 81 slices shown]
[im 27/81  soft-tissue]
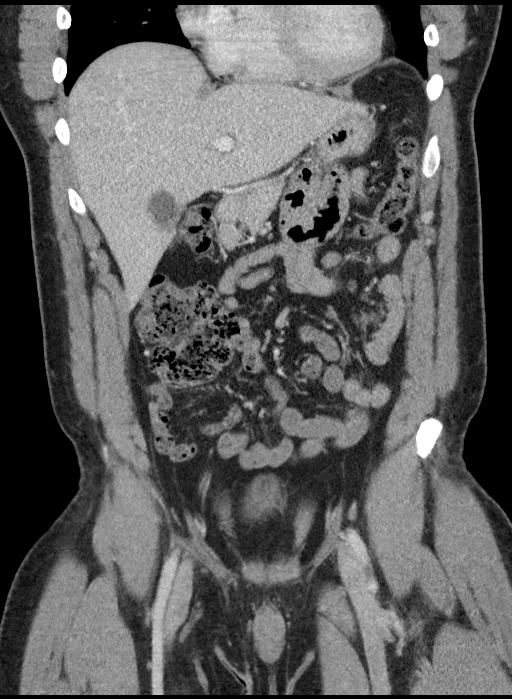
[im 36/81  soft-tissue]
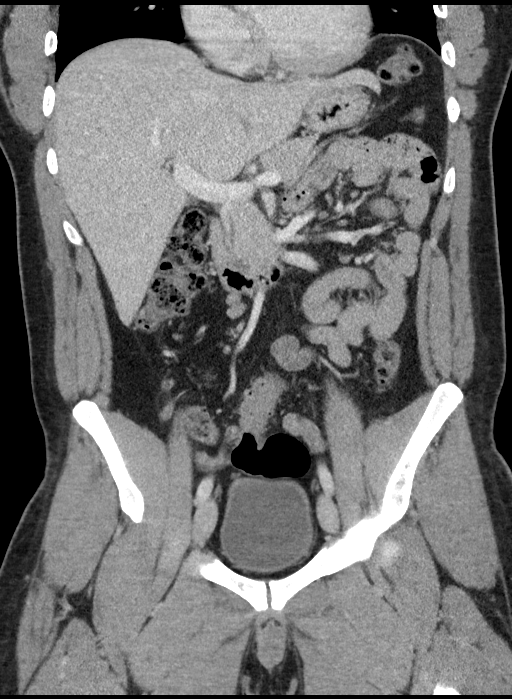
[im 45/81  soft-tissue]
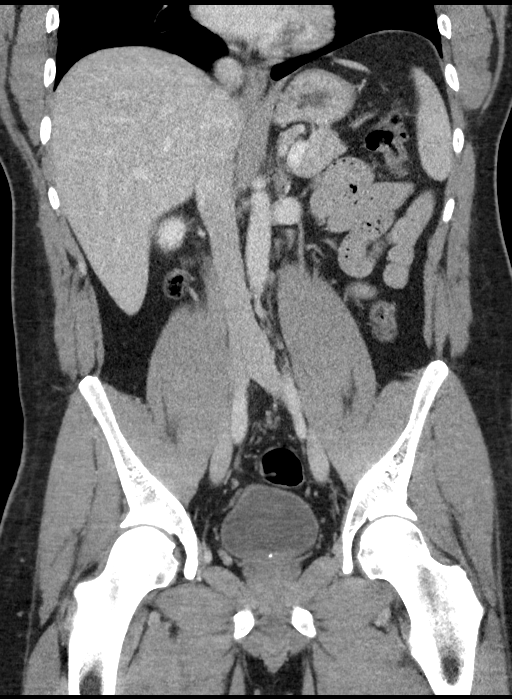

[16 of 46 positions shown; findings below may reference images not displayed]

FINDINGS: Lower chest: Minimal dependent atelectasis of posterior lung bases
are identified. The heart size is normal.

Hepatobiliary: No focal liver abnormality is seen. No gallstones,
gallbladder wall thickening, or biliary dilatation.

Pancreas: Unremarkable. No pancreatic ductal dilatation or
surrounding inflammatory changes.

Spleen: Normal in size without focal abnormality.

Adrenals/Urinary Tract: The bilateral adrenal glands are normal.
There is a 2 mm nonobstructing stone in the midpole right kidney.
There is a probable 1 mm nonobstructing stone in the mid right
ureter best seen on image 48 series 2. There is no left
hydronephrosis. The left kidney is normal. The bladder is normal.

Stomach/Bowel: Stomach is within normal limits. Appendix appears
normal. No evidence of bowel wall thickening, distention, or
inflammatory changes.

Vascular/Lymphatic: No significant vascular findings are present. No
enlarged abdominal or pelvic lymph nodes.

Reproductive: Prostate calcifications are identified.

Other: None.

Musculoskeletal: No acute or significant osseous findings.
IMPRESSION: Nonobstructing stones in the right kidney and mid right ureter. No
hydronephrosis bilaterally.

No bowel obstruction or diverticulitis.  The appendix is normal.

## 2024-06-06 ENCOUNTER — Other Ambulatory Visit: Payer: Self-pay | Admitting: Internal Medicine

## 2024-06-06 DIAGNOSIS — R319 Hematuria, unspecified: Secondary | ICD-10-CM

## 2024-06-10 ENCOUNTER — Ambulatory Visit
Admission: RE | Admit: 2024-06-10 | Discharge: 2024-06-10 | Disposition: A | Discharge status: Home / Self Care | Source: Ambulatory Visit | Attending: Internal Medicine | Admitting: Internal Medicine

## 2024-06-10 DIAGNOSIS — R319 Hematuria, unspecified: Secondary | ICD-10-CM | POA: Insufficient documentation
